# Patient Record
Sex: Female | Born: 1990 | Race: Black or African American | Hispanic: No | Marital: Single | Smoking: Current every day smoker
Health system: Southern US, Community
[De-identification: ages and names within clinical notes are randomized; demographics above are authoritative.]

## PROBLEM LIST (undated history)

## (undated) DIAGNOSIS — I10 Essential (primary) hypertension: Secondary | ICD-10-CM

---

## 2011-10-04 ENCOUNTER — Encounter: Payer: Self-pay | Admitting: *Deleted

## 2011-10-04 ENCOUNTER — Emergency Department (HOSPITAL_COMMUNITY): Payer: No Typology Code available for payment source

## 2011-10-04 ENCOUNTER — Emergency Department (HOSPITAL_COMMUNITY)
Admission: EM | Admit: 2011-10-04 | Discharge: 2011-10-04 | Disposition: A | Discharge status: Home / Self Care | Payer: No Typology Code available for payment source | Attending: Emergency Medicine | Admitting: Emergency Medicine

## 2011-10-04 DIAGNOSIS — R51 Headache: Secondary | ICD-10-CM | POA: Insufficient documentation

## 2011-10-04 DIAGNOSIS — S1093XA Contusion of unspecified part of neck, initial encounter: Secondary | ICD-10-CM | POA: Insufficient documentation

## 2011-10-04 DIAGNOSIS — T148XXA Other injury of unspecified body region, initial encounter: Secondary | ICD-10-CM

## 2011-10-04 DIAGNOSIS — S0003XA Contusion of scalp, initial encounter: Secondary | ICD-10-CM | POA: Insufficient documentation

## 2011-10-04 DIAGNOSIS — M545 Low back pain, unspecified: Secondary | ICD-10-CM | POA: Insufficient documentation

## 2011-10-04 DIAGNOSIS — F17200 Nicotine dependence, unspecified, uncomplicated: Secondary | ICD-10-CM | POA: Insufficient documentation

## 2011-10-04 DIAGNOSIS — M25569 Pain in unspecified knee: Secondary | ICD-10-CM | POA: Insufficient documentation

## 2011-10-04 MED ORDER — ACETAMINOPHEN 325 MG PO TABS
650.0000 mg | ORAL_TABLET | Freq: Once | ORAL | Status: AC
  Administered 2011-10-04: 650 mg via ORAL
  Filled 2011-10-04: qty 2

## 2011-10-04 MED ORDER — OXYCODONE-ACETAMINOPHEN 5-325 MG PO TABS: 1.0000 | ORAL_TABLET | Freq: Four times a day (QID) | ORAL | Status: AC | PRN

## 2011-10-04 MED ORDER — MORPHINE SULFATE 4 MG/ML IJ SOLN: 4.0000 mg | Freq: Once | INTRAMUSCULAR | Status: DC

## 2011-10-04 MED ORDER — NAPROXEN 500 MG PO TABS: 500.0000 mg | ORAL_TABLET | Freq: Two times a day (BID) | ORAL | Status: AC

## 2011-10-04 NOTE — ED Notes (Signed)
Patient returned from X-ray 
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
## 2011-10-04 NOTE — ED Notes (Signed)
Bed:[facility]<BR> Expected date:<BR> Expected time:<BR> Means of arrival:<BR> Comments:<BR> EMS
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
## 2013-09-02 ENCOUNTER — Encounter (HOSPITAL_BASED_OUTPATIENT_CLINIC_OR_DEPARTMENT_OTHER): Payer: Self-pay | Admitting: Emergency Medicine

## 2013-09-02 ENCOUNTER — Emergency Department (HOSPITAL_BASED_OUTPATIENT_CLINIC_OR_DEPARTMENT_OTHER)
Admission: EM | Admit: 2013-09-02 | Discharge: 2013-09-03 | Disposition: A | Discharge status: Home / Self Care | Payer: Medicaid Other | Attending: Emergency Medicine | Admitting: Emergency Medicine

## 2013-09-02 DIAGNOSIS — N390 Urinary tract infection, site not specified: Secondary | ICD-10-CM | POA: Insufficient documentation

## 2013-09-02 DIAGNOSIS — I10 Essential (primary) hypertension: Secondary | ICD-10-CM | POA: Insufficient documentation

## 2013-09-02 DIAGNOSIS — L293 Anogenital pruritus, unspecified: Secondary | ICD-10-CM | POA: Insufficient documentation

## 2013-09-02 DIAGNOSIS — B3731 Acute candidiasis of vulva and vagina: Secondary | ICD-10-CM | POA: Insufficient documentation

## 2013-09-02 DIAGNOSIS — Z3202 Encounter for pregnancy test, result negative: Secondary | ICD-10-CM | POA: Insufficient documentation

## 2013-09-02 DIAGNOSIS — B373 Candidiasis of vulva and vagina: Secondary | ICD-10-CM | POA: Insufficient documentation

## 2013-09-02 DIAGNOSIS — F17200 Nicotine dependence, unspecified, uncomplicated: Secondary | ICD-10-CM | POA: Insufficient documentation

## 2013-09-02 HISTORY — DX: Essential (primary) hypertension: I10

## 2013-09-02 LAB — URINALYSIS, ROUTINE W REFLEX MICROSCOPIC
Bilirubin Urine: NEGATIVE
Ketones, ur: 15 mg/dL — AB
Nitrite: NEGATIVE
Protein, ur: 100 mg/dL — AB
pH: 7 (ref 5.0–8.0)

## 2013-09-02 LAB — URINE MICROSCOPIC-ADD ON

## 2013-09-02 LAB — WET PREP, GENITAL
Clue Cells Wet Prep HPF POC: NONE SEEN
Trich, Wet Prep: NONE SEEN

## 2013-09-02 MED ORDER — LIDOCAINE HCL (PF) 1 % IJ SOLN
INTRAMUSCULAR | Status: AC
  Administered 2013-09-02: 1.5 mL
  Filled 2013-09-02: qty 5

## 2013-09-02 MED ORDER — CEPHALEXIN 500 MG PO CAPS: 500.0000 mg | ORAL_CAPSULE | Freq: Four times a day (QID) | ORAL | Status: AC

## 2013-09-02 MED ORDER — CEFTRIAXONE SODIUM 250 MG IJ SOLR
250.0000 mg | Freq: Once | INTRAMUSCULAR | Status: AC
  Administered 2013-09-02: 250 mg via INTRAMUSCULAR
  Filled 2013-09-02: qty 250

## 2013-09-02 MED ORDER — FLUCONAZOLE 150 MG PO TABS: 150.0000 mg | ORAL_TABLET | Freq: Every day | ORAL | Status: AC

## 2013-09-02 MED ORDER — AZITHROMYCIN 250 MG PO TABS
1000.0000 mg | ORAL_TABLET | Freq: Once | ORAL | Status: AC
Start: 2013-09-02 — End: 2013-09-02
  Administered 2013-09-02: 1000 mg via ORAL
  Filled 2013-09-02: qty 4

## 2013-09-02 NOTE — ED Provider Notes (Signed)
CSN: [id number]     Arrival date & time 09/02/13  1924 History   First MD Initiated Contact with Patient 09/02/13 2137     Chief Complaint  Patient presents with  . Vaginitis    itching and very sore   (Consider location/radiation/quality/duration/timing/severity/associated sxs/prior Treatment) Patient is a 22 y.o. female presenting with vaginal itching. The history is provided by the patient. No language interpreter was used.  Vaginal Itching This is a new problem. Pertinent negatives include no abdominal pain, fever, nausea, rash or vomiting. Associated symptoms comments: Vaginal itching, urinary frequency and dysuria for the past 4 days. She reports vulvar pain after scratching extensively. No fever. No abdominal pain, N, V, fever. No irregular bleeding..    Past Medical History  Diagnosis Date  . Hypertension    History reviewed. No pertinent past surgical history. Family History  Problem Relation Age of Onset  . Cancer Other    History  Substance Use Topics  . Smoking status: Current Every Day Smoker -- 0.50 packs/day    Types: Cigarettes  . Smokeless tobacco: Never Used  . Alcohol Use: No   OB History   Grav Para Term Preterm Abortions TAB SAB Ect Mult Living                 Review of Systems  Constitutional: Negative for fever.  Gastrointestinal: Negative for nausea, vomiting and abdominal pain.  Genitourinary: Positive for dysuria, frequency and vaginal pain. Negative for vaginal bleeding, vaginal discharge and pelvic pain.  Skin: Negative for rash.    Allergies  Review of patient's allergies indicates no known allergies.  Home Medications  No current outpatient prescriptions on file. BP 157/100  Temp(Src) 99.7 F (37.6 C) (Oral)  Resp 18  SpO2 100%  LMP 08/12/2013 Physical Exam  Constitutional: She is oriented to person, place, and time. She appears well-developed and well-nourished. No distress.  HENT:  Head: Normocephalic.  Neck: Normal range of  motion. Neck supple.  Cardiovascular: Normal rate and regular rhythm.   Pulmonary/Chest: Effort normal and breath sounds normal.  Abdominal: Soft. Bowel sounds are normal. There is no tenderness. There is no rebound and no guarding.  Genitourinary: Vagina normal and uterus normal. No vaginal discharge found.  Thin vaginal discharge in vaginal vault. Ulcerations to external labia without erythema or blistering. No pelvic tenderness.   Musculoskeletal: Normal range of motion.  Neurological: She is alert and oriented to person, place, and time.  Skin: Skin is warm and dry. No rash noted.  Psychiatric: She has a normal mood and affect.    ED Course  Procedures (including critical care time) Labs Review Labs Reviewed  URINALYSIS, ROUTINE W REFLEX MICROSCOPIC - Abnormal; Notable for the following:    Color, Urine AMBER (*)    APPearance CLOUDY (*)    Hgb urine dipstick SMALL (*)    Ketones, ur 15 (*)    Protein, ur 100 (*)    Leukocytes, UA LARGE (*)    All other components within normal limits  URINE MICROSCOPIC-ADD ON - Abnormal; Notable for the following:    Squamous Epithelial / LPF FEW (*)    Bacteria, UA MANY (*)    All other components within normal limits  URINE CULTURE  PREGNANCY, URINE   Results for orders placed during the hospital encounter of 09/02/13  WET PREP, GENITAL      Result Value Range   Yeast Wet Prep HPF POC NONE SEEN  NONE SEEN   Trich, Wet Prep NONE  SEEN  NONE SEEN   Clue Cells Wet Prep HPF POC NONE SEEN  NONE SEEN   WBC, Wet Prep HPF POC MANY (*) NONE SEEN  URINALYSIS, ROUTINE W REFLEX MICROSCOPIC      Result Value Range   Color, Urine AMBER (*) YELLOW   APPearance CLOUDY (*) CLEAR   Specific Gravity, Urine 1.028  1.005 - 1.030   pH 7.0  5.0 - 8.0   Glucose, UA NEGATIVE  NEGATIVE mg/dL   Hgb urine dipstick SMALL (*) NEGATIVE   Bilirubin Urine NEGATIVE  NEGATIVE   Ketones, ur 15 (*) NEGATIVE mg/dL   Protein, ur 161 (*) NEGATIVE mg/dL   Urobilinogen,  UA 1.0  0.0 - 1.0 mg/dL   Nitrite NEGATIVE  NEGATIVE   Leukocytes, UA LARGE (*) NEGATIVE  PREGNANCY, URINE      Result Value Range   Preg Test, Ur NEGATIVE  NEGATIVE  URINE MICROSCOPIC-ADD ON      Result Value Range   Squamous Epithelial / LPF FEW (*) RARE   WBC, UA TOO NUMEROUS TO COUNT  <3 WBC/hpf   RBC / HPF 7-10  <3 RBC/hpf   Bacteria, UA MANY (*) RARE   Urine-Other MUCOUS PRESENT      Imaging Review No results found.  EKG Interpretation   None       MDM  No diagnosis found. 1. UTI 2. Vaginal yeast infection.  Referred to [facility] for further evaluation of multiple concerns regards STD's. Stable for discharge.       Arnoldo Hooker, PA-C 09/03/13 2038

## 2013-09-02 NOTE — ED Notes (Signed)
Vaginal pain and  Soreness denies discharge but feels very irritated and painful

## 2013-09-03 NOTE — ED Provider Notes (Signed)
Medical screening examination/treatment/procedure(s) were performed by non-physician practitioner and as supervising physician I was immediately available for consultation/collaboration.  EKG Interpretation   None        Beryl Balz B. Bernette Mayers, MD 09/03/13 2046

## 2013-09-04 LAB — URINE CULTURE: Colony Count: 35000

## 2013-09-05 LAB — GC/CHLAMYDIA PROBE AMP
CT Probe RNA: NEGATIVE
GC Probe RNA: POSITIVE — AB

## 2013-09-06 NOTE — ED Notes (Signed)
+   Gonorrhea Patient treated with rocephin and zithromax

## 2013-09-09 ENCOUNTER — Telehealth (HOSPITAL_BASED_OUTPATIENT_CLINIC_OR_DEPARTMENT_OTHER): Payer: Self-pay | Admitting: Emergency Medicine
